# Patient Record
Sex: Male | Born: 1945 | Race: White | Hispanic: No | Marital: Married | Smoking: Former smoker
Health system: Southern US, Community
[De-identification: ages and names within clinical notes are randomized; demographics above are authoritative.]

## PROBLEM LIST (undated history)

## (undated) DIAGNOSIS — E7800 Pure hypercholesterolemia, unspecified: Secondary | ICD-10-CM

## (undated) DIAGNOSIS — Z8719 Personal history of other diseases of the digestive system: Secondary | ICD-10-CM

## (undated) DIAGNOSIS — J439 Emphysema, unspecified: Secondary | ICD-10-CM

## (undated) DIAGNOSIS — R748 Abnormal levels of other serum enzymes: Secondary | ICD-10-CM

## (undated) DIAGNOSIS — S83249A Other tear of medial meniscus, current injury, unspecified knee, initial encounter: Secondary | ICD-10-CM

## (undated) DIAGNOSIS — R519 Headache, unspecified: Secondary | ICD-10-CM

## (undated) DIAGNOSIS — G4730 Sleep apnea, unspecified: Secondary | ICD-10-CM

## (undated) DIAGNOSIS — C801 Malignant (primary) neoplasm, unspecified: Secondary | ICD-10-CM

## (undated) DIAGNOSIS — K4020 Bilateral inguinal hernia, without obstruction or gangrene, not specified as recurrent: Secondary | ICD-10-CM

## (undated) DIAGNOSIS — I2510 Atherosclerotic heart disease of native coronary artery without angina pectoris: Secondary | ICD-10-CM

## (undated) DIAGNOSIS — Z87442 Personal history of urinary calculi: Secondary | ICD-10-CM

## (undated) DIAGNOSIS — N179 Acute kidney failure, unspecified: Secondary | ICD-10-CM

## (undated) DIAGNOSIS — M722 Plantar fascial fibromatosis: Secondary | ICD-10-CM

## (undated) DIAGNOSIS — D049 Carcinoma in situ of skin, unspecified: Secondary | ICD-10-CM

## (undated) DIAGNOSIS — L719 Rosacea, unspecified: Secondary | ICD-10-CM

## (undated) DIAGNOSIS — K429 Umbilical hernia without obstruction or gangrene: Secondary | ICD-10-CM

## (undated) HISTORY — PX: EYE SURGERY: SHX253

## (undated) HISTORY — PX: COLONOSCOPY: SHX174

## (undated) HISTORY — PX: OTHER SURGICAL HISTORY: SHX169

## (undated) HISTORY — PX: HERNIA REPAIR: SHX51

---

## 2004-05-23 ENCOUNTER — Ambulatory Visit: Payer: Self-pay | Admitting: Ophthalmology

## 2004-07-25 ENCOUNTER — Ambulatory Visit: Payer: Self-pay | Admitting: Ophthalmology

## 2005-11-10 ENCOUNTER — Ambulatory Visit: Payer: Self-pay | Admitting: Unknown Physician Specialty

## 2008-05-04 ENCOUNTER — Other Ambulatory Visit: Payer: Self-pay

## 2008-05-04 ENCOUNTER — Ambulatory Visit: Payer: Self-pay | Admitting: General Surgery

## 2008-05-04 ENCOUNTER — Ambulatory Visit: Payer: Self-pay | Admitting: Cardiology

## 2008-05-05 ENCOUNTER — Ambulatory Visit: Payer: Self-pay | Admitting: General Surgery

## 2012-08-14 DIAGNOSIS — M6208 Separation of muscle (nontraumatic), other site: Secondary | ICD-10-CM

## 2012-08-14 HISTORY — DX: Separation of muscle (nontraumatic), other site: M62.08

## 2013-07-04 ENCOUNTER — Ambulatory Visit: Payer: Self-pay | Admitting: Family Medicine

## 2013-08-14 DIAGNOSIS — E785 Hyperlipidemia, unspecified: Secondary | ICD-10-CM

## 2013-08-14 DIAGNOSIS — I10 Essential (primary) hypertension: Secondary | ICD-10-CM

## 2013-08-14 HISTORY — DX: Essential (primary) hypertension: I10

## 2013-08-14 HISTORY — DX: Hyperlipidemia, unspecified: E78.5

## 2014-07-24 ENCOUNTER — Ambulatory Visit: Payer: Self-pay | Admitting: Family Medicine

## 2016-02-28 ENCOUNTER — Other Ambulatory Visit: Payer: Self-pay | Admitting: Family Medicine

## 2016-02-28 DIAGNOSIS — R911 Solitary pulmonary nodule: Secondary | ICD-10-CM

## 2016-03-07 ENCOUNTER — Ambulatory Visit: Payer: 59

## 2016-03-10 ENCOUNTER — Other Ambulatory Visit: Payer: Self-pay | Admitting: Family Medicine

## 2016-03-10 DIAGNOSIS — Z87891 Personal history of nicotine dependence: Secondary | ICD-10-CM

## 2016-03-10 DIAGNOSIS — R911 Solitary pulmonary nodule: Secondary | ICD-10-CM

## 2016-03-13 ENCOUNTER — Ambulatory Visit
Admission: RE | Admit: 2016-03-13 | Discharge: 2016-03-13 | Disposition: A | Discharge status: Home / Self Care | Payer: 59 | Source: Ambulatory Visit | Attending: Family Medicine | Admitting: Family Medicine

## 2016-03-13 DIAGNOSIS — Z87891 Personal history of nicotine dependence: Secondary | ICD-10-CM | POA: Insufficient documentation

## 2016-03-13 DIAGNOSIS — I2510 Atherosclerotic heart disease of native coronary artery without angina pectoris: Secondary | ICD-10-CM | POA: Insufficient documentation

## 2016-03-13 DIAGNOSIS — R911 Solitary pulmonary nodule: Secondary | ICD-10-CM | POA: Insufficient documentation

## 2016-03-13 DIAGNOSIS — I700 Atherosclerosis of aorta: Secondary | ICD-10-CM | POA: Insufficient documentation

## 2017-08-14 DIAGNOSIS — R739 Hyperglycemia, unspecified: Secondary | ICD-10-CM

## 2017-08-14 HISTORY — DX: Hyperglycemia, unspecified: R73.9

## 2017-09-11 ENCOUNTER — Other Ambulatory Visit: Payer: Self-pay | Admitting: Family Medicine

## 2017-09-11 DIAGNOSIS — Z122 Encounter for screening for malignant neoplasm of respiratory organs: Secondary | ICD-10-CM

## 2017-09-11 DIAGNOSIS — R911 Solitary pulmonary nodule: Secondary | ICD-10-CM

## 2017-09-17 ENCOUNTER — Other Ambulatory Visit: Payer: Self-pay | Admitting: Family Medicine

## 2017-09-17 DIAGNOSIS — R911 Solitary pulmonary nodule: Secondary | ICD-10-CM

## 2017-09-18 ENCOUNTER — Ambulatory Visit
Admission: RE | Admit: 2017-09-18 | Discharge: 2017-09-18 | Disposition: A | Discharge status: Home / Self Care | Payer: Commercial Managed Care - HMO | Source: Ambulatory Visit | Attending: Family Medicine | Admitting: Family Medicine

## 2017-09-18 DIAGNOSIS — I700 Atherosclerosis of aorta: Secondary | ICD-10-CM | POA: Insufficient documentation

## 2017-09-18 DIAGNOSIS — R911 Solitary pulmonary nodule: Secondary | ICD-10-CM | POA: Diagnosis present

## 2020-07-27 ENCOUNTER — Other Ambulatory Visit: Payer: Self-pay | Admitting: Family Medicine

## 2020-07-27 DIAGNOSIS — Z122 Encounter for screening for malignant neoplasm of respiratory organs: Secondary | ICD-10-CM

## 2020-07-27 DIAGNOSIS — H18469 Peripheral corneal degeneration, unspecified eye: Secondary | ICD-10-CM

## 2020-08-11 ENCOUNTER — Ambulatory Visit: Payer: Medicare HMO

## 2020-08-17 ENCOUNTER — Ambulatory Visit: Payer: Medicare HMO

## 2020-09-02 ENCOUNTER — Ambulatory Visit: Payer: Medicare HMO

## 2020-09-14 ENCOUNTER — Ambulatory Visit: Payer: Medicare HMO

## 2021-08-30 ENCOUNTER — Telehealth: Payer: Self-pay | Admitting: Acute Care

## 2021-08-30 NOTE — Telephone Encounter (Signed)
Returned call to patient regarding LCS.  No answer. Left voicemail and call back number.

## 2021-08-31 ENCOUNTER — Other Ambulatory Visit: Payer: Self-pay

## 2021-08-31 DIAGNOSIS — Z87891 Personal history of nicotine dependence: Secondary | ICD-10-CM

## 2021-09-14 ENCOUNTER — Ambulatory Visit (INDEPENDENT_AMBULATORY_CARE_PROVIDER_SITE_OTHER): Payer: Medicare HMO | Admitting: Acute Care

## 2021-09-14 ENCOUNTER — Encounter: Payer: Self-pay | Admitting: Acute Care

## 2021-09-14 ENCOUNTER — Other Ambulatory Visit: Payer: Self-pay

## 2021-09-14 DIAGNOSIS — Z87891 Personal history of nicotine dependence: Secondary | ICD-10-CM | POA: Diagnosis not present

## 2021-09-14 NOTE — Progress Notes (Addendum)
Virtual Visit via Telephone Note  I connected with Jeffery Cannon on 09/21/21 at 11:00 AM EST by telephone and verified that I am speaking with the correct person using two identifiers.  Location: Patient: At home  Provider:  [place], [place], [place], [address]    I discussed the limitations, risks, security and privacy concerns of performing an evaluation and management service by telephone and the availability of in person appointments. I also discussed with the patient that there may be a patient responsible charge related to this service. The patient expressed understanding and agreed to proceed.   Shared Decision Making Visit Lung Cancer Screening Program [phone])   Eligibility: Age 76 y.o. Pack Years Smoking History Calculation 20 pack year smoking hx  (# packs/per year x # years smoked) Recent History of coughing up blood  no Unexplained weight loss? no ( >Than 15 pounds within the last 6 months ) Prior History Lung / other cancer no (Diagnosis within the last 5 years already requiring surveillance chest CT Scans). Smoking Status Former Smoker Former Smokers: Years since quit: 12 years  Quit Date: 2012  Visit Components: Discussion included one or more decision making aids. yes Discussion included risk/benefits of screening. yes Discussion included potential follow up diagnostic testing for abnormal scans. yes Discussion included meaning and risk of over diagnosis. yes Discussion included meaning and risk of False Positives. yes Discussion included meaning of total radiation exposure. yes  Counseling Included: Importance of adherence to annual lung cancer LDCT screening. yes Impact of comorbidities on ability to participate in the program. yes Ability and willingness to under diagnostic treatment. yes  Smoking Cessation Counseling: Current Smokers:  Discussed importance of smoking cessation. yes Information about tobacco cessation classes and  interventions provided to patient. yes Patient provided with "ticket" for LDCT Scan. yes Symptomatic Patient. no  Counseling NA Diagnosis Code: Tobacco Use Z72.0 Asymptomatic Patient yes  Counseling (Intermediate counseling: > three minutes counseling) F6812 Former Smokers:  Discussed the importance of maintaining cigarette abstinence. yes Diagnosis Code: Personal History of Nicotine Dependence. X51.700 Information about tobacco cessation classes and interventions provided to patient. Yes Patient provided with "ticket" for LDCT Scan. yes Written Order for Lung Cancer Screening with LDCT placed in Epic. Yes (CT Chest Lung Cancer Screening Low Dose W/O CM) [id number] Z12.2-Screening of respiratory organs Z87.891-Personal history of nicotine dependence  I spent 25 minutes of face to face time/virtual visit time  with Jeffery Cannon discussing the risks and benefits of lung cancer screening. We took the time to pause the power point at intervals to allow for questions to be asked and answered to ensure understanding. We discussed that he had taken the single most powerful action possible to decrease his risk of developing lung cancer when he quit smoking. I counseled him to remain smoke free, and to contact me if he ever had the desire to smoke again so that I can provide resources and tools to help support the effort to remain smoke free. We discussed the time and location of the scan, and that either  Jeffery Glassman RN, Jeffery Prince, RN or I  or I will call / send a letter with the results within  24-72 hours of receiving them. She has the office contact information in the event she needs to speak with me,  he verbalized understanding of all of the above and had no further questions upon leaving the office.     I explained to the patient that there has  been a high incidence of coronary artery disease noted on these exams. I explained that this is a non-gated exam therefore degree or severity cannot be  determined. This patient is on statin therapy. I have asked the patient to follow-up with their PCP regarding any incidental finding of coronary artery disease and management with diet or medication as they feel is clinically indicated. The patient verbalized understanding of the above and had no further questions.   I spent 30 minutes dedicated to the care of this patient on the date of this encounter to include pre-visit review of records, non-face-to-face time with the patient discussing conditions above, post visit ordering of testing, clinical documentation with the electronic health record, making appropriate referrals as documented, and communicating necessary information to the patient's healthcare team.   Magdalen Spatz, NP 09/14/2021

## 2021-09-14 NOTE — Patient Instructions (Signed)
Thank you for participating in the [facility] Screening [facility]. °It was our pleasure to meet you today. °We will call you with the results of your scan within the next few days. °Your scan will be assigned a Lung RADS category score by the physicians reading the scans.  °This Lung RADS score determines follow up scanning.  °See below for description of categories, and follow up screening recommendations. °We will be in touch to schedule your follow up screening annually or based on recommendations of our providers. °We will fax a copy of your scan results to your Primary Care Physician, or the physician who referred you to the program, to ensure they have the results. °Please call the office if you have any questions or concerns regarding your scanning experience or results.  °Our office number is [phone]. °Please speak with Denise Phelps, RN. She is our Lung Cancer Screening RN. °If she is unavailable when you call, please have the office staff send her a message. She will return your call at her earliest convenience. °Remember, if your scan is normal, we will scan you annually as long as you continue to meet the criteria for the program. (Age 55-77, Current smoker or smoker who has quit within the last 15 years). °If you are a smoker, remember, quitting is the single most powerful action that you can take to decrease your risk of lung cancer and other pulmonary, breathing related problems. °We know quitting is hard, and we are here to help.  °Please let us know if there is anything we can do to help you meet your goal of quitting. °If you are a former smoker, congratulations. We are proud of you! Remain smoke free! °Remember you can refer friends or family members through the number above.  °We will screen them to make sure they meet criteria for the program. °Thank you for helping us take better care of you by participating in Lung Screening. ° °You can receive free nicotine replacement therapy  ( patches, gum or mints) by calling 1-800-QUIT NOW. Please call so we can get you on the path to becoming  a non-smoker. I know it is hard, but you can do this! ° °Lung RADS Categories: ° °Lung RADS 1: no nodules or definitely non-concerning nodules.  °Recommendation is for a repeat annual scan in 12 months. ° °Lung RADS 2:  nodules that are non-concerning in appearance and behavior with a very low likelihood of becoming an active cancer. °Recommendation is for a repeat annual scan in 12 months. ° °Lung RADS 3: nodules that are probably non-concerning , includes nodules with a low likelihood of becoming an active cancer.  Recommendation is for a 6-month repeat screening scan. Often noted after an upper respiratory illness. We will be in touch to make sure you have no questions, and to schedule your 6-month scan. ° °Lung RADS 4 A: nodules with concerning findings, recommendation is most often for a follow up scan in 3 months or additional testing based on our provider's assessment of the scan. We will be in touch to make sure you have no questions and to schedule the recommended 3 month follow up scan. ° °Lung RADS 4 B:  indicates findings that are concerning. We will be in touch with you to schedule additional diagnostic testing based on our provider's  assessment of the scan. ° °Hypnosis for smoking cessation  °[employer] °[phone] ° °Acupuncture for smoking cessation  °[facility] °[phone]  °

## 2021-09-15 ENCOUNTER — Ambulatory Visit
Admission: RE | Admit: 2021-09-15 | Discharge: 2021-09-15 | Disposition: A | Discharge status: Home / Self Care | Payer: Medicare HMO | Source: Ambulatory Visit | Attending: Acute Care | Admitting: Acute Care

## 2021-09-15 ENCOUNTER — Other Ambulatory Visit: Payer: Self-pay

## 2021-09-15 DIAGNOSIS — Z87891 Personal history of nicotine dependence: Secondary | ICD-10-CM | POA: Insufficient documentation

## 2021-09-19 ENCOUNTER — Other Ambulatory Visit: Payer: Self-pay | Admitting: Acute Care

## 2021-09-19 DIAGNOSIS — Z87891 Personal history of nicotine dependence: Secondary | ICD-10-CM

## 2022-02-06 ENCOUNTER — Other Ambulatory Visit: Payer: Self-pay | Admitting: Family Medicine

## 2022-02-06 DIAGNOSIS — R1032 Left lower quadrant pain: Secondary | ICD-10-CM

## 2022-02-10 ENCOUNTER — Ambulatory Visit
Admission: RE | Admit: 2022-02-10 | Discharge: 2022-02-10 | Disposition: A | Discharge status: Home / Self Care | Payer: Medicare HMO | Source: Ambulatory Visit | Attending: Family Medicine | Admitting: Family Medicine

## 2022-02-10 DIAGNOSIS — R1032 Left lower quadrant pain: Secondary | ICD-10-CM | POA: Diagnosis present

## 2022-02-28 ENCOUNTER — Ambulatory Visit: Payer: Self-pay | Admitting: General Surgery

## 2022-02-28 NOTE — H&P (Signed)
PATIENT PROFILE: Jeffery Cannon is a 76 y.o. male who presents to the Clinic for consultation at the request of Dr. Baldemar Lenis for evaluation of bilateral inguinal hernia.  PCP:  Barnabas Lister, MD  HISTORY OF PRESENT ILLNESS: Jeffery Cannon reports he has been feeling a bulge in the left groin.  These usually cause discomfort and mild pain in the left groin.  No pain radiation.  This aggravated by straining and different strenuous activities.  Alleviating factors resting and reducing the hernia.  He feels that there is intestine in the bulge.  This is soft and he is able to reduce.  He denies any episode of abdominal distention nausea or vomiting.  Due to the difficulty feeling the bulge on physical exam by the PCP CT scan of the abdomen and pelvis was ordered that shows bilateral fat-containing inguinal hernia.  I personally evaluated the images.   PROBLEM LIST: Problem List  Date Reviewed: 02/06/2022          Noted   Elevated liver enzymes 05/16/2019   Elevated blood sugar level, unspecified 09/07/2017   Essential hypertension, benign 01/26/2014   Other and unspecified hyperlipidemia 01/26/2014   Medial meniscus tear 01/23/2013   Diastasis recti 09/30/2012    GENERAL REVIEW OF SYSTEMS:   General ROS: negative for - chills, fatigue, fever, weight gain or weight loss Allergy and Immunology ROS: negative for - hives  Hematological and Lymphatic ROS: negative for - bleeding problems or bruising, negative for palpable nodes Endocrine ROS: negative for - heat or cold intolerance, hair changes Respiratory ROS: negative for - cough, shortness of breath or wheezing Cardiovascular ROS: no chest pain or palpitations GI ROS: negative for nausea, vomiting, abdominal pain, diarrhea, constipation Musculoskeletal ROS: negative for - joint swelling or muscle pain Neurological ROS: negative for - confusion, syncope Dermatological ROS: negative for pruritus and rash Psychiatric: negative for anxiety,  depression, difficulty sleeping and memory loss  MEDICATIONS: Current Outpatient Medications  Medication Sig Dispense Refill   amLODIPine (NORVASC) 5 MG tablet TAKE 1 TABLET BY MOUTH EVERY DAY 90 tablet 1   aspirin 81 MG EC tablet Take 81 mg by mouth nightly.     co-enzyme Q-10, ubiquinone, 200 mg capsule Take 200 mg by mouth daily.     cyanocobalamin, vitamin B-12, 500 mcg TbER Take 1 tablet by mouth daily.     GLUC SU/CHONDRO SU A/VIT C/MN (GLUCOSAMINE CHONDROITIN MAXSTR ORAL) Take 1 tablet by mouth daily.     HYDROcodone-acetaminophen (NORCO) 5-325 mg tablet Take 1 tablet by mouth every 6 (six) hours as needed for Pain 20 tablet 0   KRILL OIL ORAL Take 1 tablet by mouth daily.     magnesium oxide (MAG-OX) 400 mg (241.3 mg magnesium) tablet Take 400 mg by mouth once daily     meloxicam (MOBIC) 15 MG tablet Take 1 tablet (15 mg total) by mouth once daily     multivitamin capsule Take 1 capsule by mouth daily.     rosuvastatin (CRESTOR) 20 MG tablet Take 20 mg by mouth once daily     telmisartan-hydroCHLOROthiazide (MICARDIS HCT) 80-25 mg tablet TAKE 1 TABLET BY MOUTH EVERY DAY *REPLACES OLMESARTAN/HCTZ* 90 tablet 1   turmeric 400 mg Cap Take 400 mg by mouth once daily     No current facility-administered medications for this visit.    ALLERGIES: Patient has no known allergies.  PAST MEDICAL HISTORY: Past Medical History:  Diagnosis Date   Cancer (CMS-HCC)    basal cell carcinoma- skin  Hyperlipidemia    Hypertension    Sleep apnea    uses CPAP   Umbilical hernia     PAST SURGICAL HISTORY: Past Surgical History:  Procedure Laterality Date   COLONOSCOPY  11/10/2005   Int Hemorrhoids, Diverticulosis: CBF 10/2015; Recall Ltr mailed 09/10/2015 (dw)   ARTHROSCOPY KNEE W/MENISCECTOMY Right 02/24/2013   Procedure: ARTHROSCOPY, KNEE,  WITH MENISCECTOMY ;  Surgeon: Kristine Garbe, MD;  Location: [facility];  Service: Orthopedics;  Laterality: Right;   ARTHROSCOPY KNEE  W/SYNOVECTOMY Right 02/24/2013   Procedure: ARTHROSCOPY KNEE W/SYNOVECTOMY;  Surgeon: Opal Sidles III, MD;  Location: [facility];  Service: Orthopedics;  Laterality: Right;   COLONOSCOPY  10/10/2016   Hyperplastic Polyp: CBF 09/2026   CATARACT EXTRACTION     FRACTURE NASAL TURBINATES     HERNIA REPAIR     umbilical     FAMILY HISTORY: Family History  Problem Relation Age of Onset   High blood pressure (Hypertension) Mother    Cancer Mother        Bladder Cancer   High blood pressure (Hypertension) Father    Heart disease Father    Anesthesia problems Neg Hx      SOCIAL HISTORY: Social History   Socioeconomic History   Marital status: Married  Tobacco Use   Smoking status: Former    Types: Cigarettes    Quit date: 10/01/1983    Years since quitting: 38.4   Smokeless tobacco: Never  Substance and Sexual Activity   Alcohol use: Yes   Drug use: Never   Sexual activity: Defer    PHYSICAL EXAM: Vitals:   02/28/22 0948  BP: 134/73  Pulse: 54   Body mass index is 29.5 kg/m. Weight: 88 kg (194 lb)   GENERAL: Alert, active, oriented x3  HEENT: Pupils equal reactive to light. Extraocular movements are intact. Sclera clear. Palpebral conjunctiva normal red color.Pharynx clear.  NECK: Supple with no palpable mass and no adenopathy.  LUNGS: Sound clear with no rales rhonchi or wheezes.  HEART: Regular rhythm S1 and S2 without murmur.  ABDOMEN: Soft and depressible, nontender with no palpable mass, no hepatomegaly.  Palpable left reducible inguinal hernia.  Difficult to palpate right middle hernia.  EXTREMITIES: Well-developed well-nourished symmetrical with no dependent edema.  NEUROLOGICAL: Awake alert oriented, facial expression symmetrical, moving all extremities.  REVIEW OF DATA: I have reviewed the following data today: Appointment on 02/03/2022  Component Date Value   WBC (White Blood Cell Co* 02/03/2022 7.2    RBC (Red Blood Cell Coun* 02/03/2022 4.96     Hemoglobin 02/03/2022 15.0    Hematocrit 02/03/2022 45.7    MCV (Mean Corpuscular Vo* 02/03/2022 92.1    MCH (Mean Corpuscular He* 02/03/2022 30.2    MCHC (Mean Corpuscular H* 02/03/2022 32.8    Platelet Count 02/03/2022 227    RDW-CV (Red Cell Distrib* 02/03/2022 13.1    MPV (Mean Platelet Volum* 02/03/2022 9.5    Neutrophils 02/03/2022 4.50    Lymphocytes 02/03/2022 2.00    Mixed Count 02/03/2022 0.70    Neutrophil % 02/03/2022 62.8    Lymphocyte % 02/03/2022 27.7    Mixed % 02/03/2022 9.5    Glucose 02/03/2022 90    Sodium 02/03/2022 141    Potassium 02/03/2022 4.6    Chloride 02/03/2022 104    Carbon Dioxide (CO2) 02/03/2022 30.5    Urea Nitrogen (BUN) 02/03/2022 21    Creatinine 02/03/2022 1.3    Glomerular Filtration Ra* 02/03/2022 54 (L)  Calcium 02/03/2022 9.7    AST  02/03/2022 26    ALT  02/03/2022 24    Alk Phos (alkaline Phosp* 02/03/2022 51    Albumin 02/03/2022 4.8    Bilirubin, Total 02/03/2022 0.6    Protein, Total 02/03/2022 7.0    A/G Ratio 02/03/2022 2.2    Hemoglobin A1C 02/03/2022 5.9 (H)    Average Blood Glucose (C* 02/03/2022 123    Cholesterol, Total 02/03/2022 156    Triglyceride 02/03/2022 123    HDL (High Density Lipopr* 02/03/2022 66.7    LDL Calculated 02/03/2022 65    VLDL Cholesterol 02/03/2022 25    Cholesterol/HDL Ratio 02/03/2022 2.3      ASSESSMENT: Mr. Guzzetta is a 76 y.o. male presenting for consultation for bilateral inguinal hernia.    The patient presents with a symptomatic, reducible bilateral inguinal hernia. Patient was oriented about the diagnosis of inguinal hernia and its implication. The patient was oriented about the treatment alternatives (observation vs surgical repair). Due to patient symptoms, repair is recommended. Patient oriented about the surgical procedure, the use of mesh and its risk of complications such as: infection, bleeding, injury to vas deference, vasculature and testicle, injury to bowel or bladder, and  chronic pain.   Non-recurrent bilateral inguinal hernia without obstruction or gangrene [K40.20]  PLAN: 1.  Robotic assisted laparoscopic bilateral inguinal hernia repair with mesh ([id number]) 2.  Hold aspirin today 3.  Contact us if has any question or concern.   Patient verbalized understanding, all questions were answered, and were agreeable with the plan outlined above.    Herbert Pun, MD  Electronically signed by Herbert Pun, MD

## 2022-02-28 NOTE — H&P (View-Only) (Signed)
PATIENT PROFILE: Jeffery Cannon is a 76 y.o. male who presents to the Clinic for consultation at the request of Dr. Baldemar Lenis for evaluation of bilateral inguinal hernia.  PCP:  Barnabas Lister, MD  HISTORY OF PRESENT ILLNESS: Mr. Busk reports he has been feeling a bulge in the left groin.  These usually cause discomfort and mild pain in the left groin.  No pain radiation.  This aggravated by straining and different strenuous activities.  Alleviating factors resting and reducing the hernia.  He feels that there is intestine in the bulge.  This is soft and he is able to reduce.  He denies any episode of abdominal distention nausea or vomiting.  Due to the difficulty feeling the bulge on physical exam by the PCP CT scan of the abdomen and pelvis was ordered that shows bilateral fat-containing inguinal hernia.  I personally evaluated the images.   PROBLEM LIST: Problem List  Date Reviewed: 02/06/2022          Noted   Elevated liver enzymes 05/16/2019   Elevated blood sugar level, unspecified 09/07/2017   Essential hypertension, benign 01/26/2014   Other and unspecified hyperlipidemia 01/26/2014   Medial meniscus tear 01/23/2013   Diastasis recti 09/30/2012    GENERAL REVIEW OF SYSTEMS:   General ROS: negative for - chills, fatigue, fever, weight gain or weight loss Allergy and Immunology ROS: negative for - hives  Hematological and Lymphatic ROS: negative for - bleeding problems or bruising, negative for palpable nodes Endocrine ROS: negative for - heat or cold intolerance, hair changes Respiratory ROS: negative for - cough, shortness of breath or wheezing Cardiovascular ROS: no chest pain or palpitations GI ROS: negative for nausea, vomiting, abdominal pain, diarrhea, constipation Musculoskeletal ROS: negative for - joint swelling or muscle pain Neurological ROS: negative for - confusion, syncope Dermatological ROS: negative for pruritus and rash Psychiatric: negative for anxiety,  depression, difficulty sleeping and memory loss  MEDICATIONS: Current Outpatient Medications  Medication Sig Dispense Refill   amLODIPine (NORVASC) 5 MG tablet TAKE 1 TABLET BY MOUTH EVERY DAY 90 tablet 1   aspirin 81 MG EC tablet Take 81 mg by mouth nightly.     co-enzyme Q-10, ubiquinone, 200 mg capsule Take 200 mg by mouth daily.     cyanocobalamin, vitamin B-12, 500 mcg TbER Take 1 tablet by mouth daily.     GLUC SU/CHONDRO SU A/VIT C/MN (GLUCOSAMINE CHONDROITIN MAXSTR ORAL) Take 1 tablet by mouth daily.     HYDROcodone-acetaminophen (NORCO) 5-325 mg tablet Take 1 tablet by mouth every 6 (six) hours as needed for Pain 20 tablet 0   KRILL OIL ORAL Take 1 tablet by mouth daily.     magnesium oxide (MAG-OX) 400 mg (241.3 mg magnesium) tablet Take 400 mg by mouth once daily     meloxicam (MOBIC) 15 MG tablet Take 1 tablet (15 mg total) by mouth once daily     multivitamin capsule Take 1 capsule by mouth daily.     rosuvastatin (CRESTOR) 20 MG tablet Take 20 mg by mouth once daily     telmisartan-hydroCHLOROthiazide (MICARDIS HCT) 80-25 mg tablet TAKE 1 TABLET BY MOUTH EVERY DAY *REPLACES OLMESARTAN/HCTZ* 90 tablet 1   turmeric 400 mg Cap Take 400 mg by mouth once daily     No current facility-administered medications for this visit.    ALLERGIES: Patient has no known allergies.  PAST MEDICAL HISTORY: Past Medical History:  Diagnosis Date   Cancer (CMS-HCC)    basal cell carcinoma- skin  Hyperlipidemia    Hypertension    Sleep apnea    uses CPAP   Umbilical hernia     PAST SURGICAL HISTORY: Past Surgical History:  Procedure Laterality Date   COLONOSCOPY  11/10/2005   Int Hemorrhoids, Diverticulosis: CBF 10/2015; Recall Ltr mailed 09/10/2015 (dw)   ARTHROSCOPY KNEE W/MENISCECTOMY Right 02/24/2013   Procedure: ARTHROSCOPY, KNEE,  WITH MENISCECTOMY ;  Surgeon: Kristine Garbe, MD;  Location: [facility];  Service: Orthopedics;  Laterality: Right;   ARTHROSCOPY KNEE  W/SYNOVECTOMY Right 02/24/2013   Procedure: ARTHROSCOPY KNEE W/SYNOVECTOMY;  Surgeon: Opal Sidles III, MD;  Location: [facility];  Service: Orthopedics;  Laterality: Right;   COLONOSCOPY  10/10/2016   Hyperplastic Polyp: CBF 09/2026   CATARACT EXTRACTION     FRACTURE NASAL TURBINATES     HERNIA REPAIR     umbilical     FAMILY HISTORY: Family History  Problem Relation Age of Onset   High blood pressure (Hypertension) Mother    Cancer Mother        Bladder Cancer   High blood pressure (Hypertension) Father    Heart disease Father    Anesthesia problems Neg Hx      SOCIAL HISTORY: Social History   Socioeconomic History   Marital status: Married  Tobacco Use   Smoking status: Former    Types: Cigarettes    Quit date: 10/01/1983    Years since quitting: 38.4   Smokeless tobacco: Never  Substance and Sexual Activity   Alcohol use: Yes   Drug use: Never   Sexual activity: Defer    PHYSICAL EXAM: Vitals:   02/28/22 0948  BP: 134/73  Pulse: 54   Body mass index is 29.5 kg/m. Weight: 88 kg (194 lb)   GENERAL: Alert, active, oriented x3  HEENT: Pupils equal reactive to light. Extraocular movements are intact. Sclera clear. Palpebral conjunctiva normal red color.Pharynx clear.  NECK: Supple with no palpable mass and no adenopathy.  LUNGS: Sound clear with no rales rhonchi or wheezes.  HEART: Regular rhythm S1 and S2 without murmur.  ABDOMEN: Soft and depressible, nontender with no palpable mass, no hepatomegaly.  Palpable left reducible inguinal hernia.  Difficult to palpate right middle hernia.  EXTREMITIES: Well-developed well-nourished symmetrical with no dependent edema.  NEUROLOGICAL: Awake alert oriented, facial expression symmetrical, moving all extremities.  REVIEW OF DATA: I have reviewed the following data today: Appointment on 02/03/2022  Component Date Value   WBC (White Blood Cell Co* 02/03/2022 7.2    RBC (Red Blood Cell Coun* 02/03/2022 4.96     Hemoglobin 02/03/2022 15.0    Hematocrit 02/03/2022 45.7    MCV (Mean Corpuscular Vo* 02/03/2022 92.1    MCH (Mean Corpuscular He* 02/03/2022 30.2    MCHC (Mean Corpuscular H* 02/03/2022 32.8    Platelet Count 02/03/2022 227    RDW-CV (Red Cell Distrib* 02/03/2022 13.1    MPV (Mean Platelet Volum* 02/03/2022 9.5    Neutrophils 02/03/2022 4.50    Lymphocytes 02/03/2022 2.00    Mixed Count 02/03/2022 0.70    Neutrophil % 02/03/2022 62.8    Lymphocyte % 02/03/2022 27.7    Mixed % 02/03/2022 9.5    Glucose 02/03/2022 90    Sodium 02/03/2022 141    Potassium 02/03/2022 4.6    Chloride 02/03/2022 104    Carbon Dioxide (CO2) 02/03/2022 30.5    Urea Nitrogen (BUN) 02/03/2022 21    Creatinine 02/03/2022 1.3    Glomerular Filtration Ra* 02/03/2022 54 (L)  Calcium 02/03/2022 9.7    AST  02/03/2022 26    ALT  02/03/2022 24    Alk Phos (alkaline Phosp* 02/03/2022 51    Albumin 02/03/2022 4.8    Bilirubin, Total 02/03/2022 0.6    Protein, Total 02/03/2022 7.0    A/G Ratio 02/03/2022 2.2    Hemoglobin A1C 02/03/2022 5.9 (H)    Average Blood Glucose (C* 02/03/2022 123    Cholesterol, Total 02/03/2022 156    Triglyceride 02/03/2022 123    HDL (High Density Lipopr* 02/03/2022 66.7    LDL Calculated 02/03/2022 65    VLDL Cholesterol 02/03/2022 25    Cholesterol/HDL Ratio 02/03/2022 2.3      ASSESSMENT: Mr. Higbie is a 76 y.o. male presenting for consultation for bilateral inguinal hernia.    The patient presents with a symptomatic, reducible bilateral inguinal hernia. Patient was oriented about the diagnosis of inguinal hernia and its implication. The patient was oriented about the treatment alternatives (observation vs surgical repair). Due to patient symptoms, repair is recommended. Patient oriented about the surgical procedure, the use of mesh and its risk of complications such as: infection, bleeding, injury to vas deference, vasculature and testicle, injury to bowel or bladder, and  chronic pain.   Non-recurrent bilateral inguinal hernia without obstruction or gangrene [K40.20]  PLAN: 1.  Robotic assisted laparoscopic bilateral inguinal hernia repair with mesh ([id number]) 2.  Hold aspirin today 3.  Contact us if has any question or concern.   Patient verbalized understanding, all questions were answered, and were agreeable with the plan outlined above.    Herbert Pun, MD  Electronically signed by Herbert Pun, MD

## 2022-03-02 ENCOUNTER — Other Ambulatory Visit: Payer: Self-pay

## 2022-03-02 ENCOUNTER — Encounter
Admission: RE | Admit: 2022-03-02 | Discharge: 2022-03-02 | Disposition: A | Discharge status: Home / Self Care | Payer: Medicare HMO | Source: Ambulatory Visit | Attending: General Surgery | Admitting: General Surgery

## 2022-03-02 DIAGNOSIS — I10 Essential (primary) hypertension: Secondary | ICD-10-CM

## 2022-03-02 DIAGNOSIS — Z01812 Encounter for preprocedural laboratory examination: Secondary | ICD-10-CM

## 2022-03-02 HISTORY — DX: Personal history of urinary calculi: Z87.442

## 2022-03-02 HISTORY — DX: Rosacea, unspecified: L71.9

## 2022-03-02 HISTORY — DX: Emphysema, unspecified: J43.9

## 2022-03-02 HISTORY — DX: Acute kidney failure, unspecified: N17.9

## 2022-03-02 HISTORY — DX: Carcinoma in situ of skin, unspecified: D04.9

## 2022-03-02 HISTORY — DX: Headache, unspecified: R51.9

## 2022-03-02 HISTORY — DX: Personal history of other diseases of the digestive system: Z87.19

## 2022-03-02 HISTORY — DX: Abnormal levels of other serum enzymes: R74.8

## 2022-03-02 HISTORY — DX: Other tear of medial meniscus, current injury, unspecified knee, initial encounter: S83.249A

## 2022-03-02 HISTORY — DX: Plantar fascial fibromatosis: M72.2

## 2022-03-02 HISTORY — DX: Umbilical hernia without obstruction or gangrene: K42.9

## 2022-03-02 HISTORY — DX: Pure hypercholesterolemia, unspecified: E78.00

## 2022-03-02 HISTORY — DX: Bilateral inguinal hernia, without obstruction or gangrene, not specified as recurrent: K40.20

## 2022-03-02 HISTORY — DX: Sleep apnea, unspecified: G47.30

## 2022-03-02 HISTORY — DX: Malignant (primary) neoplasm, unspecified: C80.1

## 2022-03-02 HISTORY — DX: Atherosclerotic heart disease of native coronary artery without angina pectoris: I25.10

## 2022-03-02 NOTE — Patient Instructions (Addendum)
Your procedure is scheduled on: 03/06/22 - Monday Report to the Registration Desk on the 1st floor of the [facility]. To find out your arrival time, please call [phone] between 1PM - 3PM on 03/03/22 - Friday If your arrival time is 6:00 am, do not arrive prior to that time as the [facility] entrance doors do not open until 6:00 am.  REMEMBER: Instructions that are not followed completely may result in serious medical risk, up to and including death; or upon the discretion of your surgeon and anesthesiologist your surgery may need to be rescheduled.  Do not eat food or drink any fluids after midnight the night before surgery.  No gum chewing, lozengers or hard candies.  TAKE THESE MEDICATIONS THE MORNING OF SURGERY WITH A SIP OF WATER: NONE  Follow recommendations from Cardiologist, Pulmonologist or PCP regarding stopping Aspirin, Coumadin, Plavix, Eliquis, Pradaxa, or Pletal. Stop taking ASA 81 beginning 02/28/22.  One week prior to surgery: Stop Anti-inflammatories (NSAIDS) such as Advil, Aleve, Ibuprofen, Motrin, Naproxen, Naprosyn and Aspirin based products such as Excedrin, Goodys Powder, BC Powder.  Stop ANY OVER THE COUNTER supplements until after surgery.  You may  take Tylenol if needed for pain up until the day of surgery.  No Alcohol for 24 hours before or after surgery.  No Smoking including e-cigarettes for 24 hours prior to surgery.  No chewable tobacco products for at least 6 hours prior to surgery.  No nicotine patches on the day of surgery.  Do not use any "recreational" drugs for at least a week prior to your surgery.  Please be advised that the combination of cocaine and anesthesia may have negative outcomes, up to and including death. If you test positive for cocaine, your surgery will be cancelled.  On the morning of surgery brush your teeth with toothpaste and water, you may rinse your mouth with mouthwash if you wish. Do not swallow any toothpaste or  mouthwash.  Use CHG Soap or wipes as directed on instruction sheet.  Do not wear jewelry, make-up, hairpins, clips or nail polish.  Do not wear lotions, powders, or perfumes.   Do not shave body from the neck down 48 hours prior to surgery just in case you cut yourself which could leave a site for infection.  Also, freshly shaved skin may become irritated if using the CHG soap.  Contact lenses, hearing aids and dentures may not be worn into surgery.  Do not bring valuables to the hospital. [facility] is not responsible for any missing/lost belongings or valuables.   Bring your C-PAP to the hospital with you in case you may have to spend the night.   Notify your doctor if there is any change in your medical condition (cold, fever, infection).  Wear comfortable clothing (specific to your surgery type) to the hospital.  After surgery, you can help prevent lung complications by doing breathing exercises.  Take deep breaths and cough every 1-2 hours. Your doctor may order a device called an Incentive Spirometer to help you take deep breaths. When coughing or sneezing, hold a pillow firmly against your incision with both hands. This is called "splinting." Doing this helps protect your incision. It also decreases belly discomfort.  If you are being admitted to the hospital overnight, leave your suitcase in the car. After surgery it may be brought to your room.  If you are being discharged the day of surgery, you will not be allowed to drive home. You will need a  responsible adult (18 years or older) to drive you home and stay with you that night.   If you are taking public transportation, you will need to have a responsible adult (18 years or older) with you. Please confirm with your physician that it is acceptable to use public transportation.   Please call the [facility] Dept. at [phone] if you have any questions about these instructions.  Surgery Visitation  Policy:  Patients undergoing a surgery or procedure may have two family members or support persons with them as long as the person is not COVID-19 positive or experiencing its symptoms.   Inpatient Visitation:    Visiting hours are 7 a.m. to 8 p.m. Up to four visitors are allowed at one time in a patient room, including children. The visitors may rotate out with other people during the day. One designated support person (adult) may remain overnight.

## 2022-03-03 ENCOUNTER — Encounter
Admission: RE | Admit: 2022-03-03 | Discharge: 2022-03-03 | Disposition: A | Discharge status: Home / Self Care | Payer: Medicare HMO | Source: Ambulatory Visit | Attending: General Surgery | Admitting: General Surgery

## 2022-03-03 DIAGNOSIS — I10 Essential (primary) hypertension: Secondary | ICD-10-CM

## 2022-03-03 DIAGNOSIS — Z01812 Encounter for preprocedural laboratory examination: Secondary | ICD-10-CM

## 2022-03-03 DIAGNOSIS — Z01818 Encounter for other preprocedural examination: Secondary | ICD-10-CM | POA: Insufficient documentation

## 2022-03-06 ENCOUNTER — Ambulatory Visit: Payer: Medicare HMO | Admitting: Anesthesiology

## 2022-03-06 ENCOUNTER — Encounter
Admission: RE | Disposition: A | Discharge status: Home / Self Care | Payer: Self-pay | Source: Home / Self Care | Attending: General Surgery

## 2022-03-06 ENCOUNTER — Encounter: Payer: Self-pay | Admitting: General Surgery

## 2022-03-06 ENCOUNTER — Other Ambulatory Visit: Payer: Self-pay

## 2022-03-06 ENCOUNTER — Ambulatory Visit
Admission: RE | Admit: 2022-03-06 | Discharge: 2022-03-06 | Disposition: A | Discharge status: Home / Self Care | Payer: Medicare HMO | Attending: General Surgery | Admitting: General Surgery

## 2022-03-06 DIAGNOSIS — Z7982 Long term (current) use of aspirin: Secondary | ICD-10-CM | POA: Diagnosis not present

## 2022-03-06 DIAGNOSIS — Z87891 Personal history of nicotine dependence: Secondary | ICD-10-CM | POA: Insufficient documentation

## 2022-03-06 DIAGNOSIS — I10 Essential (primary) hypertension: Secondary | ICD-10-CM | POA: Diagnosis not present

## 2022-03-06 DIAGNOSIS — K4020 Bilateral inguinal hernia, without obstruction or gangrene, not specified as recurrent: Secondary | ICD-10-CM | POA: Insufficient documentation

## 2022-03-06 DIAGNOSIS — I2510 Atherosclerotic heart disease of native coronary artery without angina pectoris: Secondary | ICD-10-CM | POA: Insufficient documentation

## 2022-03-06 HISTORY — PX: INSERTION OF MESH: SHX5868

## 2022-03-06 SURGERY — REPAIR, HERNIA, INGUINAL, BILATERAL, ROBOT-ASSISTED
Anesthesia: General | Site: Groin | Laterality: Bilateral

## 2022-03-06 MED ORDER — FENTANYL CITRATE (PF) 100 MCG/2ML IJ SOLN
INTRAMUSCULAR | Status: AC
  Filled 2022-03-06: qty 2

## 2022-03-06 MED ORDER — ACETAMINOPHEN 10 MG/ML IV SOLN: 1000.0000 mg | Freq: Once | INTRAVENOUS | Status: DC | PRN

## 2022-03-06 MED ORDER — ACETAMINOPHEN 10 MG/ML IV SOLN
INTRAVENOUS | Status: AC
  Filled 2022-03-06: qty 100

## 2022-03-06 MED ORDER — OXYCODONE HCL 5 MG PO TABS
ORAL_TABLET | ORAL | Status: AC
  Filled 2022-03-06: qty 1

## 2022-03-06 MED ORDER — FENTANYL CITRATE (PF) 100 MCG/2ML IJ SOLN
INTRAMUSCULAR | Status: DC | PRN
  Administered 2022-03-06 (×2): 50 ug via INTRAVENOUS

## 2022-03-06 MED ORDER — ONDANSETRON HCL 4 MG/2ML IJ SOLN
INTRAMUSCULAR | Status: DC | PRN
  Administered 2022-03-06: 4 mg via INTRAVENOUS

## 2022-03-06 MED ORDER — EPHEDRINE 5 MG/ML INJ
INTRAVENOUS | Status: AC
  Filled 2022-03-06: qty 5

## 2022-03-06 MED ORDER — CEFAZOLIN SODIUM-DEXTROSE 2-4 GM/100ML-% IV SOLN
2.0000 g | INTRAVENOUS | Status: AC
  Administered 2022-03-06: 2 g via INTRAVENOUS

## 2022-03-06 MED ORDER — DEXAMETHASONE SODIUM PHOSPHATE 10 MG/ML IJ SOLN
INTRAMUSCULAR | Status: DC | PRN
  Administered 2022-03-06: 10 mg via INTRAVENOUS

## 2022-03-06 MED ORDER — LACTATED RINGERS IV SOLN: INTRAVENOUS | Status: DC

## 2022-03-06 MED ORDER — OXYCODONE HCL 5 MG PO TABS
5.0000 mg | ORAL_TABLET | Freq: Once | ORAL | Status: AC | PRN
  Administered 2022-03-06: 5 mg via ORAL

## 2022-03-06 MED ORDER — ONDANSETRON HCL 4 MG/2ML IJ SOLN: 4.0000 mg | Freq: Once | INTRAMUSCULAR | Status: DC | PRN

## 2022-03-06 MED ORDER — LIDOCAINE HCL (CARDIAC) PF 100 MG/5ML IV SOSY
PREFILLED_SYRINGE | INTRAVENOUS | Status: DC | PRN
  Administered 2022-03-06: 80 mg via INTRAVENOUS

## 2022-03-06 MED ORDER — SUGAMMADEX SODIUM 200 MG/2ML IV SOLN
INTRAVENOUS | Status: DC | PRN
  Administered 2022-03-06: 200 mg via INTRAVENOUS

## 2022-03-06 MED ORDER — OXYCODONE HCL 5 MG/5ML PO SOLN: 5.0000 mg | Freq: Once | ORAL | Status: AC | PRN

## 2022-03-06 MED ORDER — LIDOCAINE HCL (PF) 2 % IJ SOLN
INTRAMUSCULAR | Status: AC
  Filled 2022-03-06: qty 5

## 2022-03-06 MED ORDER — LACTATED RINGERS IV SOLN
INTRAVENOUS | Status: DC
Start: 2022-03-06 — End: 2022-03-06

## 2022-03-06 MED ORDER — BUPIVACAINE-EPINEPHRINE 0.25% -1:200000 IJ SOLN
INTRAMUSCULAR | Status: DC | PRN
  Administered 2022-03-06: 30 mL

## 2022-03-06 MED ORDER — PROPOFOL 10 MG/ML IV BOLUS
INTRAVENOUS | Status: DC | PRN
  Administered 2022-03-06: 140 mg via INTRAVENOUS

## 2022-03-06 MED ORDER — BUPIVACAINE-EPINEPHRINE (PF) 0.25% -1:200000 IJ SOLN
INTRAMUSCULAR | Status: AC
  Filled 2022-03-06: qty 30

## 2022-03-06 MED ORDER — CHLORHEXIDINE GLUCONATE 0.12 % MT SOLN
OROMUCOSAL | Status: AC
  Administered 2022-03-06: 15 mL via OROMUCOSAL
  Filled 2022-03-06: qty 15

## 2022-03-06 MED ORDER — HYDROCODONE-ACETAMINOPHEN 5-325 MG PO TABS
1.0000 | ORAL_TABLET | ORAL | 0 refills | Status: AC | PRN
Start: 2022-03-06 — End: 2022-03-09

## 2022-03-06 MED ORDER — CHLORHEXIDINE GLUCONATE 0.12 % MT SOLN: 15.0000 mL | Freq: Once | OROMUCOSAL | Status: AC

## 2022-03-06 MED ORDER — GLYCOPYRROLATE 0.2 MG/ML IJ SOLN
INTRAMUSCULAR | Status: DC | PRN
  Administered 2022-03-06: .2 mg via INTRAVENOUS

## 2022-03-06 MED ORDER — ORAL CARE MOUTH RINSE: 15.0000 mL | Freq: Once | OROMUCOSAL | Status: AC

## 2022-03-06 MED ORDER — EPHEDRINE SULFATE (PRESSORS) 50 MG/ML IJ SOLN
INTRAMUSCULAR | Status: DC | PRN
  Administered 2022-03-06 (×2): 5 mg via INTRAVENOUS

## 2022-03-06 MED ORDER — CEFAZOLIN SODIUM-DEXTROSE 2-4 GM/100ML-% IV SOLN
INTRAVENOUS | Status: AC
  Filled 2022-03-06: qty 100

## 2022-03-06 MED ORDER — GLYCOPYRROLATE 0.2 MG/ML IJ SOLN
INTRAMUSCULAR | Status: AC
  Filled 2022-03-06: qty 1

## 2022-03-06 MED ORDER — PROPOFOL 10 MG/ML IV BOLUS
INTRAVENOUS | Status: AC
  Filled 2022-03-06: qty 20

## 2022-03-06 MED ORDER — ACETAMINOPHEN 10 MG/ML IV SOLN
INTRAVENOUS | Status: DC | PRN
  Administered 2022-03-06: 1000 mg via INTRAVENOUS

## 2022-03-06 MED ORDER — FAMOTIDINE 20 MG PO TABS: 20.0000 mg | ORAL_TABLET | Freq: Once | ORAL | Status: AC

## 2022-03-06 MED ORDER — ROCURONIUM BROMIDE 100 MG/10ML IV SOLN
INTRAVENOUS | Status: DC | PRN
  Administered 2022-03-06 (×3): 20 mg via INTRAVENOUS
  Administered 2022-03-06: 40 mg via INTRAVENOUS

## 2022-03-06 MED ORDER — SEVOFLURANE IN SOLN
RESPIRATORY_TRACT | Status: AC
  Filled 2022-03-06: qty 250

## 2022-03-06 MED ORDER — FAMOTIDINE 20 MG PO TABS
ORAL_TABLET | ORAL | Status: AC
  Administered 2022-03-06: 20 mg via ORAL
  Filled 2022-03-06: qty 1

## 2022-03-06 MED ORDER — ONDANSETRON HCL 4 MG/2ML IJ SOLN
INTRAMUSCULAR | Status: AC
  Filled 2022-03-06: qty 2

## 2022-03-06 MED ORDER — FENTANYL CITRATE (PF) 100 MCG/2ML IJ SOLN
25.0000 ug | INTRAMUSCULAR | Status: DC | PRN
  Administered 2022-03-06 (×3): 50 ug via INTRAVENOUS

## 2022-03-06 SURGICAL SUPPLY — 50 items
ADH SKN CLS APL DERMABOND .7 (GAUZE/BANDAGES/DRESSINGS) ×2
BAG PRESSURE INF REUSE 1000 (BAG) IMPLANT
BLADE SURG SZ11 CARB STEEL (BLADE) ×3 IMPLANT
COVER TIP SHEARS 8 DVNC (MISCELLANEOUS) ×2 IMPLANT
COVER TIP SHEARS 8MM DA VINCI (MISCELLANEOUS) ×1
COVER WAND RF STERILE (DRAPES) ×3 IMPLANT
DERMABOND ADVANCED (GAUZE/BANDAGES/DRESSINGS) ×1
DERMABOND ADVANCED .7 DNX12 (GAUZE/BANDAGES/DRESSINGS) ×2 IMPLANT
DRAPE ARM DVNC X/XI (DISPOSABLE) ×6 IMPLANT
DRAPE COLUMN DVNC XI (DISPOSABLE) ×2 IMPLANT
DRAPE DA VINCI XI ARM (DISPOSABLE) ×3
DRAPE DA VINCI XI COLUMN (DISPOSABLE) ×1
ELECT REM PT RETURN 9FT ADLT (ELECTROSURGICAL) ×3
ELECTRODE REM PT RTRN 9FT ADLT (ELECTROSURGICAL) ×2 IMPLANT
GLOVE BIO SURGEON STRL SZ 6.5 (GLOVE) ×8 IMPLANT
GLOVE BIOGEL PI IND STRL 6.5 (GLOVE) ×4 IMPLANT
GLOVE BIOGEL PI INDICATOR 6.5 (GLOVE) ×4
GOWN STRL REUS W/ TWL LRG LVL3 (GOWN DISPOSABLE) ×6 IMPLANT
GOWN STRL REUS W/TWL LRG LVL3 (GOWN DISPOSABLE) ×12
IRRIGATOR SUCT 8 DISP DVNC XI (IRRIGATION / IRRIGATOR) IMPLANT
IRRIGATOR SUCTION 8MM XI DISP (IRRIGATION / IRRIGATOR)
IV CATH ANGIO 12GX3 LT BLUE (NEEDLE) ×1 IMPLANT
IV NS 1000ML (IV SOLUTION)
IV NS 1000ML BAXH (IV SOLUTION) IMPLANT
KIT PINK PAD W/HEAD ARE REST (MISCELLANEOUS) ×3
KIT PINK PAD W/HEAD ARM REST (MISCELLANEOUS) ×2 IMPLANT
LABEL OR SOLS (LABEL) IMPLANT
MANIFOLD NEPTUNE II (INSTRUMENTS) ×3 IMPLANT
MESH 3DMAX MID 5X7 LT XLRG (Mesh General) ×1 IMPLANT
MESH 3DMAX MID 5X7 RT XLRG (Mesh General) ×1 IMPLANT
NDL INSUFFLATION 14GA 120MM (NEEDLE) ×2 IMPLANT
NEEDLE HYPO 22GX1.5 SAFETY (NEEDLE) ×3 IMPLANT
NEEDLE INSUFFLATION 14GA 120MM (NEEDLE) ×3 IMPLANT
OBTURATOR OPTICAL STANDARD 8MM (TROCAR) ×1
OBTURATOR OPTICAL STND 8 DVNC (TROCAR) ×2
OBTURATOR OPTICALSTD 8 DVNC (TROCAR) ×2 IMPLANT
PACK LAP CHOLECYSTECTOMY (MISCELLANEOUS) ×3 IMPLANT
SEAL CANN UNIV 5-8 DVNC XI (MISCELLANEOUS) ×6 IMPLANT
SEAL XI 5MM-8MM UNIVERSAL (MISCELLANEOUS) ×3
SET TUBE SMOKE EVAC HIGH FLOW (TUBING) ×3 IMPLANT
SOLUTION ELECTROLUBE (MISCELLANEOUS) ×3 IMPLANT
SUT MNCRL 4-0 (SUTURE) ×6
SUT MNCRL 4-0 27XMFL (SUTURE) ×4
SUT VIC AB 2-0 SH 27 (SUTURE) ×6
SUT VIC AB 2-0 SH 27XBRD (SUTURE) ×2 IMPLANT
SUT VLOC 90 S/L VL9 GS22 (SUTURE) ×4 IMPLANT
SUTURE MNCRL 4-0 27XMF (SUTURE) ×2 IMPLANT
TAPE TRANSPORE STRL 2 31045 (GAUZE/BANDAGES/DRESSINGS) IMPLANT
TRAY FOLEY MTR SLVR 16FR STAT (SET/KITS/TRAYS/PACK) ×2 IMPLANT
WATER STERILE IRR 500ML POUR (IV SOLUTION) ×2 IMPLANT

## 2022-03-06 NOTE — Anesthesia Postprocedure Evaluation (Signed)
Anesthesia Post Note  Patient: Jeffery Cannon  Procedure(s) Performed: XI ROBOTIC ASSISTED BILATERAL INGUINAL HERNIA (Bilateral: Groin) INSERTION OF MESH  Patient location during evaluation: PACU Anesthesia Type: General Level of consciousness: awake and alert Pain management: pain level controlled Vital Signs Assessment: post-procedure vital signs reviewed and stable Respiratory status: spontaneous breathing, nonlabored ventilation, respiratory function stable and patient connected to nasal cannula oxygen Cardiovascular status: blood pressure returned to baseline and stable Postop Assessment: no apparent nausea or vomiting Anesthetic complications: no   No notable events documented.   Last Vitals:  Vitals:   03/06/22 1025 03/06/22 1030  BP:  104/69  Pulse: (!) 51 (!) 53  Resp: 15 13  Temp:    SpO2: 94% 95%    Last Pain:  Vitals:   03/06/22 1030  TempSrc:   PainSc: [name]

## 2022-03-06 NOTE — Anesthesia Preprocedure Evaluation (Addendum)
Anesthesia Evaluation  Patient identified by MRN, date of birth, ID band Patient awake    Reviewed: Allergy & Precautions, NPO status , Patient's Chart, lab work & pertinent test results  History of Anesthesia Complications Negative for: history of anesthetic complications  Airway Mallampati: IV   Neck ROM: Full    Dental no notable dental hx.    Pulmonary sleep apnea , former smoker (quit 2012),    Pulmonary exam normal breath sounds clear to auscultation       Cardiovascular hypertension, + CAD  Normal cardiovascular exam Rhythm:Regular Rate:Normal  ECG 03/03/22:  Sinus bradycardia Otherwise normal ECG When compared with ECG of 04-May-2008 12:29, No significant change was found  Echo 02/04/21:  1. The left ventricle is normal in size with normal wall thickness.  2. The left ventricular systolic function is normal, LVEF is visually estimated at > 55%.  3. The right ventricle is normal in size, with normal systolic function.  4. The aortic valve is trileaflet with mildly thickened leaflets with normal excursion.  5. Rhythm: Bradycardia.    Neuro/Psych  Headaches,    GI/Hepatic negative GI ROS,   Endo/Other  negative endocrine ROS  Renal/GU Renal disease (nephrolithiasis)     Musculoskeletal   Abdominal   Peds  Hematology negative hematology ROS (+)   Anesthesia Other Findings Cardiology note 01/02/22:  1. Cheyne-Stokes breathing 2. Family history of bicuspid aortic valve TTE showed no evidence of HF and trileaflet AV  -No further work up needed at this time  3. Coronary artery disease 4. History of prior cigarette smoking AAA screening negative. Lipids 07/2021: TC 193, TG 183, HDL 53, LDL 104. Reviewed left main and LAD calcified plaque on lung cancer screening CT with patient. -Increase rosuvastatin to 20 mg daily The 10-year ASCVD risk score (Arnett DK, et al., 2019) is: 25% Values used to  calculate the score: Age: 76 years Sex: Male Is Non-Hispanic African American: No Diabetic: No Tobacco smoker: No Systolic Blood Pressure: 093 mmHg Is BP treated: Yes HDL Cholesterol: 53 mg/dL Total Cholesterol: 193 mg/dL  Note: For patients with SBP <90 or >200, Total Cholesterol <130 or >320, HDL <20 or >100 which are outside of the allowable range, the calculator will use these upper or lower values to calculate the patient's risk score.  5. Essential hypertension Reasonable control, goal <130/80 -Continue telmisartan-hydrochlorothiazide 80-12.5 mg daily, amlodipine 5 mg daily  Return in about 1 year (around 01/03/2023) for Next scheduled follow up.   Reproductive/Obstetrics                            Anesthesia Physical Anesthesia Plan  ASA: 3  Anesthesia Plan: General   Post-op Pain Management:    Induction: Intravenous  PONV Risk Score and Plan: 2 and Ondansetron, Dexamethasone and Treatment may vary due to age or medical condition  Airway Management Planned: Oral ETT  Additional Equipment:   Intra-op Plan:   Post-operative Plan: Extubation in OR  Informed Consent: I have reviewed the patients History and Physical, chart, labs and discussed the procedure including the risks, benefits and alternatives for the proposed anesthesia with the patient or authorized representative who has indicated his/her understanding and acceptance.     Dental advisory given  Plan Discussed with: CRNA  Anesthesia Plan Comments: (Patient consented for risks of anesthesia including but not limited to:  - adverse reactions to medications - damage to eyes, teeth, lips or other oral mucosa -  nerve damage due to positioning  - sore throat or hoarseness - damage to heart, brain, nerves, lungs, other parts of body or loss of life  Informed patient about role of CRNA in peri- and intra-operative care.  Patient voiced understanding.)        Anesthesia Quick  Evaluation

## 2022-03-06 NOTE — Op Note (Signed)
Preoperative diagnosis: Bilateral inguinal hernia.   Postoperative diagnosis: Bilateral inguinal hernia.  Procedure: Robotic assisted Laparoscopic Transabdominal preperitoneal laparoscopic (TAPP) repair of bilateral inguinal hernia.  Anesthesia: GETA  Surgeon: Dr. Windell Moment  Wound Classification: Clean  Indications:  Patient is a 76 y.o. male developed a symptomatic bilateral inguinal hernia. Repair was indicated.  Findings: 1. Left indirect, right direct Inguinal hernias identified 2. Large left cord lipoma identified and reduced.  3. Vas deferens and cord structures identified and preserved 4. Bard Extra Large 3D Max MID Anatomical mesh used for repair 5. Adequate hemostasis.   Page 1:  Bilateral inguinal hernias Page 2: Post left inguinal hernia repair Page 3: Post right inguinal hernia repair         Description of procedure: The patient was taken to the operating room and the correct side of surgery was verified. The patient was placed supine with arms tucked at the sides. After obtaining adequate anesthesia, the patient's abdomen was prepped and draped in standard sterile fashion. The patient was placed in the Trendelenburg position. A time-out was completed verifying correct patient, procedure, site, positioning, and implant(s) and/or special equipment prior to beginning this procedure. A Veress needle was placed at the umbilicus and pneumoperitoneum created with insufflation of carbon dioxide to 15 mmHg. After the Veress needle was removed, an 8-mm trocar was placed on epigastric area and the 30 angled laparoscope inserted. Two 8-mm trocars were then placed lateral to the rectus sheath under direct visualization. Both inguinal regions were inspected and the median umbilical ligament, medial umbilical ligament, and lateral umbilical fold were identified.  The robotic arms were docked. The robotic scope was inserted and the pelvic area anatomy targeted.  The hernias from  both sides were fixed using the following technique.  The peritoneum was incised with scissors along a line 6 cm above the superior edge of the hernia defects, extending from the median umbilical ligament to the anterior superior iliac spine. The peritoneal flap was mobilized inferiorly using blunt and sharp dissection. The inferior epigastric vessels were exposed and the pubic symphysis was identified. Cooper's ligament was dissected to its junction with the iliac vein. The dissection was continued inferiorly to the iliopubic tract, with care taken to avoid injury to the femoral branch of the genitofemoral nerve and the lateral femoral cutaneous nerve. The cord structures were parietalized. The hernia was identified and reduced by gentle traction.  The hernia sac and lipoma were noted mobilized from the cord structures and reduced into the peritoneal cavity.  An extra large piece of mesh was rolled longitudinally into a compact cylinder and passed through a trocar. The cylinder was placed along the inferior aspect of the working space and unrolled into place to completely cover the direct, indirect, and femoral spaces. The mesh was secured into place superiorly to the anterior abdominal wall and inferiorly and medially to Cooper's ligament with absorbable sutures. Care was taken to avoid the inferolateral triangles containing the iliac vessels and genital nerves. The peritoneal flap was closed over the mesh and secured with suture in similar positions of safety. After ensuring adequate hemostasis, the trocars were removed and the pneumoperitoneum allowed to escape. The trocar incisions were closed using monocryl and skin adhesive dressings applied.  The patient tolerated the procedure well and was taken to the postanesthesia care unit in stable condition.   Specimen: Non  Complications: None  Estimated Blood Loss: 5 mL

## 2022-03-06 NOTE — Interval H&P Note (Signed)
History and Physical Interval Note:  03/06/2022 6:57 AM  Jeffery Cannon  has presented today for surgery, with the diagnosis of K40.20 non recurrent bil inguinal w/o obstruction or gangrene.  The various methods of treatment have been discussed with the patient and family. After consideration of risks, benefits and other options for treatment, the patient has consented to  Procedure(s): XI ROBOTIC ASSISTED BILATERAL INGUINAL HERNIA (Bilateral) as a surgical intervention.  The patient's history has been reviewed, patient examined, no change in status, stable for surgery.  I have reviewed the patient's chart and labs.  Questions were answered to the patient's satisfaction.     Herbert Pun

## 2022-03-06 NOTE — Discharge Instructions (Addendum)
  Diet: Resume home heart healthy regular diet.   Activity: No heavy lifting >20 pounds (children, pets, laundry, garbage) or strenuous activity until follow-up, but light activity and walking are encouraged. Do not drive or drink alcohol if taking narcotic pain medications.  Wound care: May shower with soapy water and pat dry (do not rub incisions), but no baths or submerging incision underwater until follow-up. (no swimming)   Medications: Resume all home medications. For mild to moderate pain: acetaminophen (Tylenol) ***or ibuprofen (if no kidney disease). Combining Tylenol with alcohol can substantially increase your risk of causing liver disease. Narcotic pain medications, if prescribed, can be used for severe pain, though may cause nausea, constipation, and drowsiness. Do not combine Tylenol and Norco within a 6 hour period as Norco contains Tylenol. If you do not need the narcotic pain medication, you do not need to fill the prescription.  Call office ([phone]) at any time if any questions, worsening pain, fevers/chills, bleeding, drainage from incision site, or other concerns.   AMBULATORY SURGERY  DISCHARGE INSTRUCTIONS   The drugs that you were given will stay in your system until tomorrow so for the next 24 hours you should not:  Drive an automobile Make any legal decisions Drink any alcoholic beverage   You may resume regular meals tomorrow.  Today it is better to start with liquids and gradually work up to solid foods.  You may eat anything you prefer, but it is better to start with liquids, then soup and crackers, and gradually work up to solid foods.   Please notify your doctor immediately if you have any unusual bleeding, trouble breathing, redness and pain at the surgery site, drainage, fever, or pain not relieved by medication.    Additional Instructions:        Please contact your physician with any problems or Same Day Surgery at [phone], Monday  through Friday 6 am to 4 pm, or  at [facility] Main number at [phone].  

## 2022-03-06 NOTE — Progress Notes (Signed)
Per Dr. Windell Moment patient can restart Asprin tomorrow 03/07/2022

## 2022-03-06 NOTE — Anesthesia Procedure Notes (Signed)
Procedure Name: Intubation Date/Time: 03/06/2022 7:37 AM  Performed by: Tollie Eth, CRNAPre-anesthesia Checklist: Patient identified, Patient being monitored, Timeout performed, Emergency Drugs available and Suction available Patient Re-evaluated:Patient Re-evaluated prior to induction Oxygen Delivery Method: Circle system utilized Preoxygenation: Pre-oxygenation with 100% oxygen Induction Type: IV induction Ventilation: Mask ventilation without difficulty Laryngoscope Size: McGraph and 4 Grade View: Grade I Tube type: Oral Tube size: 7.5 mm Number of attempts: 1 Airway Equipment and Method: Stylet and Video-laryngoscopy Placement Confirmation: ETT inserted through vocal cords under direct vision, positive ETCO2 and breath sounds checked- equal and bilateral Secured at: 23 cm Tube secured with: Tape Dental Injury: Teeth and Oropharynx as per pre-operative assessment

## 2022-03-06 NOTE — Transfer of Care (Signed)
Immediate Anesthesia Transfer of Care Note  Patient: Jeffery Cannon  Procedure(s) Performed: XI ROBOTIC ASSISTED BILATERAL INGUINAL HERNIA (Bilateral: Groin) INSERTION OF MESH  Patient Location: PACU  Anesthesia Type:General  Level of Consciousness: drowsy  Airway & Oxygen Therapy: Patient Spontanous Breathing and Patient connected to face mask oxygen  Post-op Assessment: Report given to RN and Post -op Vital signs reviewed and stable  Post vital signs: Reviewed and stable  Last Vitals:  Vitals Value Taken Time  BP 109/51 03/06/22 0952  Temp    Pulse 48 03/06/22 0955  Resp 15 03/06/22 0955  SpO2 99 % 03/06/22 0955  Vitals shown include unvalidated device data.  Last Pain:  Vitals:   03/06/22 0616  TempSrc: Temporal  PainSc: 0-No pain         Complications: No notable events documented.

## 2022-03-07 ENCOUNTER — Encounter: Payer: Self-pay | Admitting: General Surgery

## 2022-09-15 ENCOUNTER — Ambulatory Visit
Admission: RE | Admit: 2022-09-15 | Discharge: 2022-09-15 | Disposition: A | Discharge status: Home / Self Care | Payer: Medicare HMO | Source: Ambulatory Visit | Attending: Acute Care | Admitting: Acute Care

## 2022-09-15 DIAGNOSIS — Z87891 Personal history of nicotine dependence: Secondary | ICD-10-CM | POA: Insufficient documentation

## 2022-09-18 ENCOUNTER — Other Ambulatory Visit: Payer: Self-pay

## 2022-09-18 DIAGNOSIS — Z122 Encounter for screening for malignant neoplasm of respiratory organs: Secondary | ICD-10-CM

## 2022-09-18 DIAGNOSIS — Z87891 Personal history of nicotine dependence: Secondary | ICD-10-CM

## 2023-09-17 ENCOUNTER — Ambulatory Visit
Admission: RE | Admit: 2023-09-17 | Discharge: 2023-09-17 | Disposition: A | Discharge status: Home / Self Care | Payer: Medicare Other | Source: Ambulatory Visit | Attending: Acute Care | Admitting: Acute Care

## 2023-09-17 DIAGNOSIS — Z87891 Personal history of nicotine dependence: Secondary | ICD-10-CM | POA: Diagnosis present

## 2023-09-17 DIAGNOSIS — Z122 Encounter for screening for malignant neoplasm of respiratory organs: Secondary | ICD-10-CM | POA: Diagnosis present

## 2023-10-12 ENCOUNTER — Other Ambulatory Visit: Payer: Self-pay | Admitting: *Deleted

## 2023-10-12 DIAGNOSIS — Z87891 Personal history of nicotine dependence: Secondary | ICD-10-CM

## 2023-10-12 DIAGNOSIS — Z122 Encounter for screening for malignant neoplasm of respiratory organs: Secondary | ICD-10-CM

## 2024-01-16 IMAGING — CT CT CHEST LUNG CANCER SCREENING LOW DOSE W/O CM
2 of 5 series · 14 of 40 positions shown, 17 images · non-contrast
Comparison: Low-dose lung cancer screening chest CT 03/13/2016.
Chest CT 09/18/2017.

CLINICAL DATA: 75-year-old male former smoker (quit 11 years ago)
with 30 pack-year history of smoking. Lung cancer screening
examination.



[Series 3: lung 1.00 · axial · 0.77mm/px · z∈[-1208,-897]mm · 11 of 343 slices shown, 14 images]
[im 16/343  mediastinal]
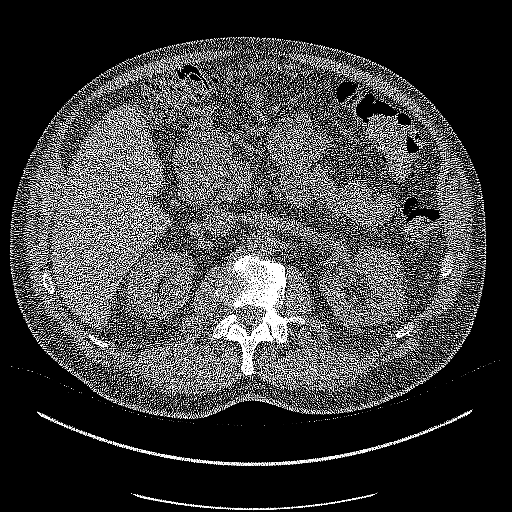
[im 16/343  lung]
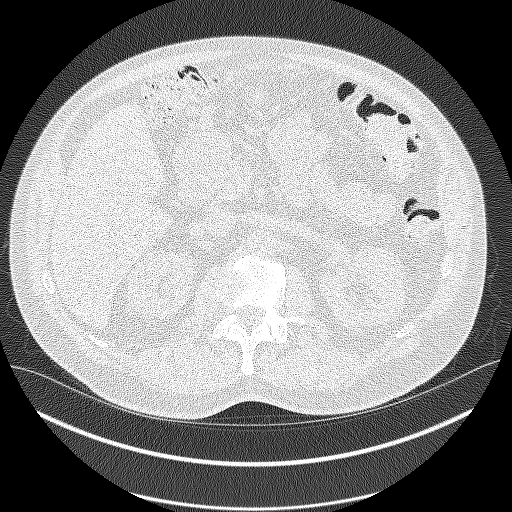
[im 47/343  lung]
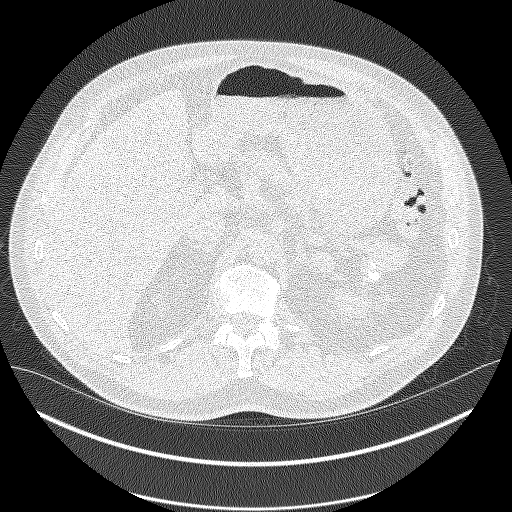
[im 78/343  lung]
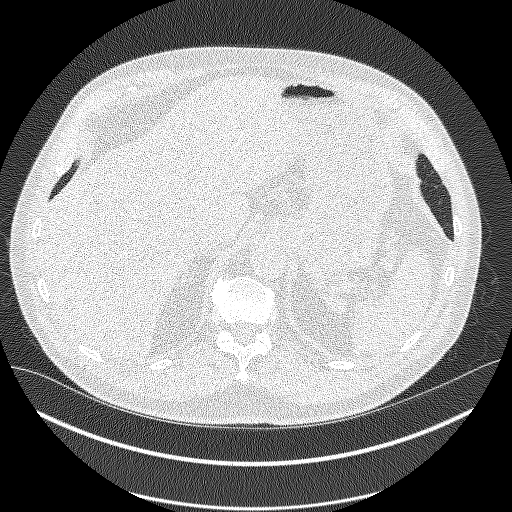
[im 109/343  lung]
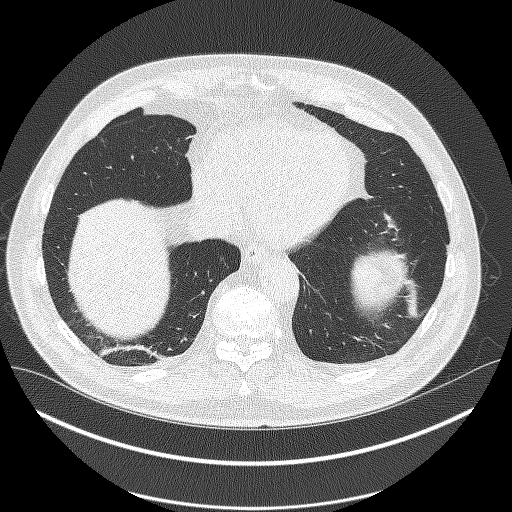
[im 140/343  mediastinal]
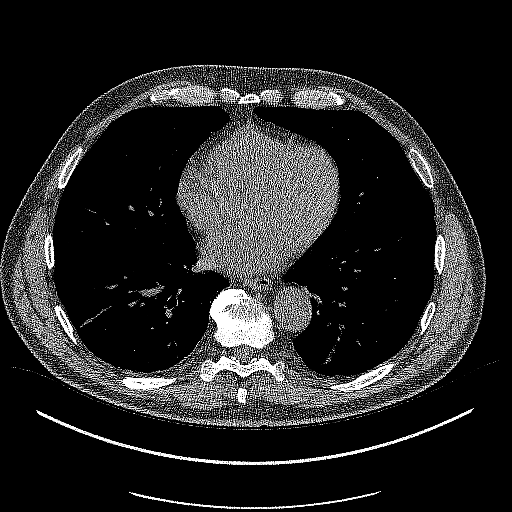
[im 140/343  lung]
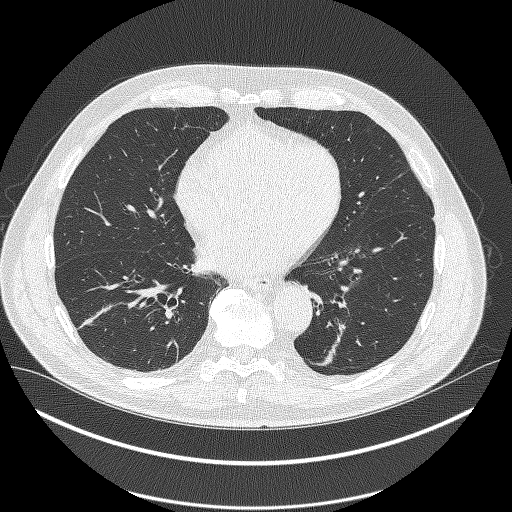
[im 172/343  lung]
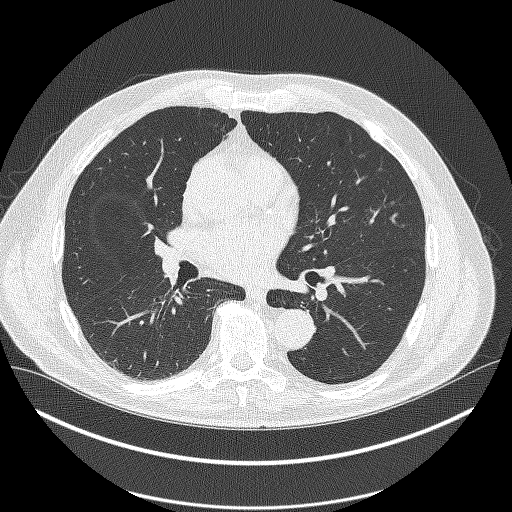
[im 203/343  lung]
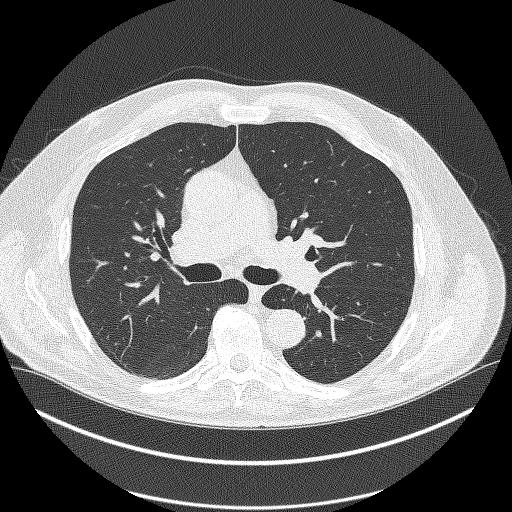
[im 234/343  lung]
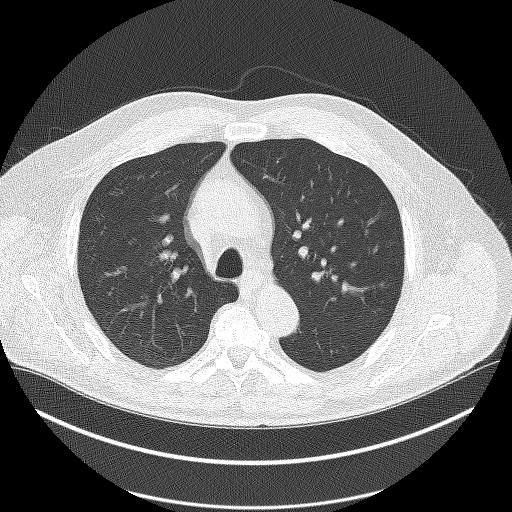
[im 265/343  mediastinal]
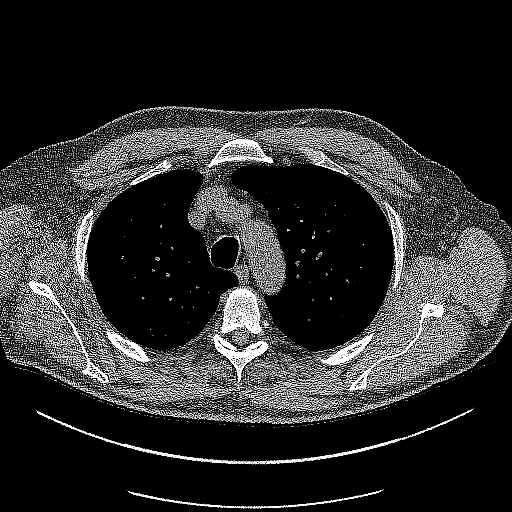
[im 265/343  lung]
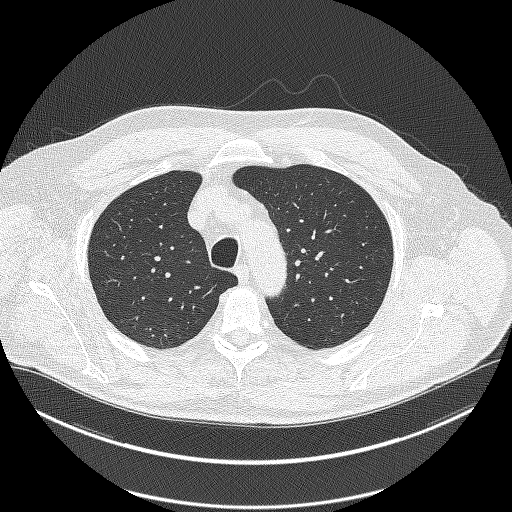
[im 296/343  lung]
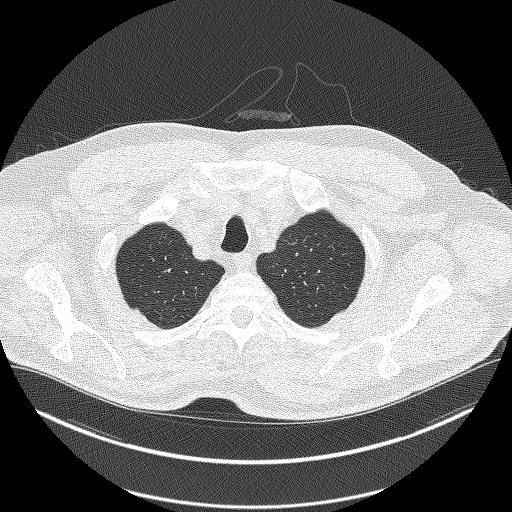
[im 327/343  lung]
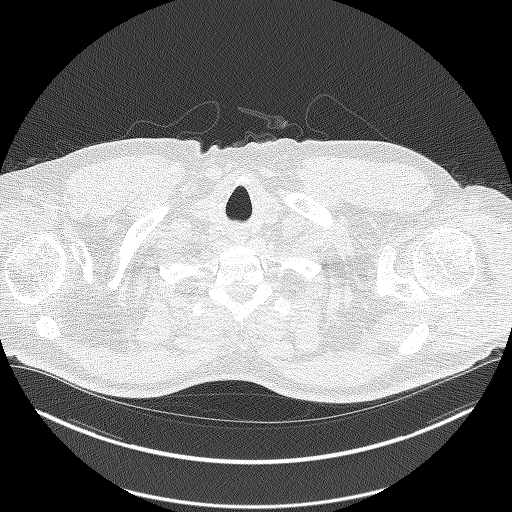

[Series 5: coronals lung 1.00 cor · coronal · 0.67mm/px · 3 of 371 slices shown]
[im 75/371  lung]
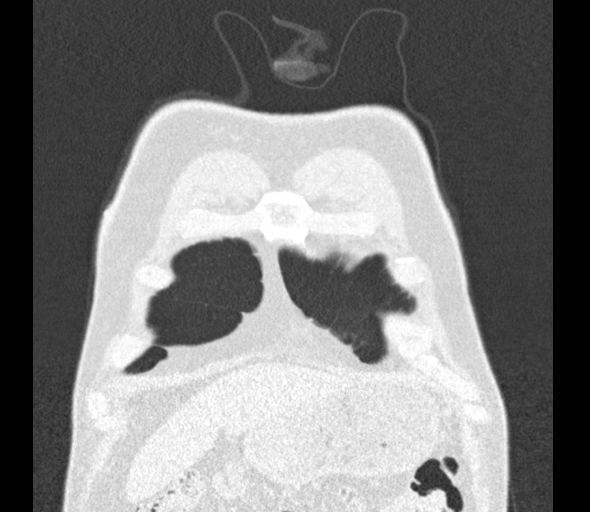
[im 149/371  lung]
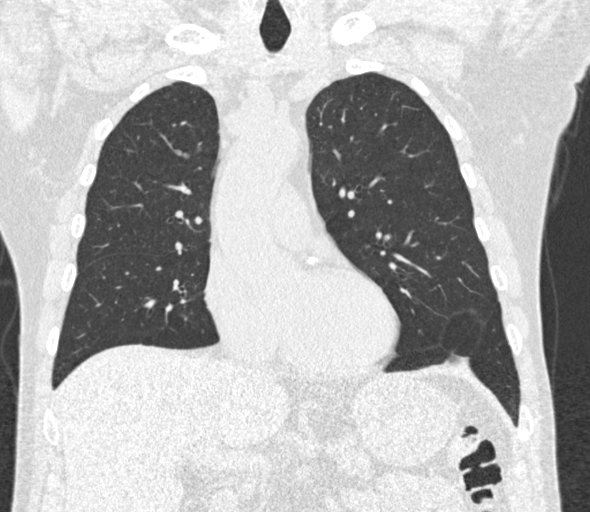
[im 223/371  lung]
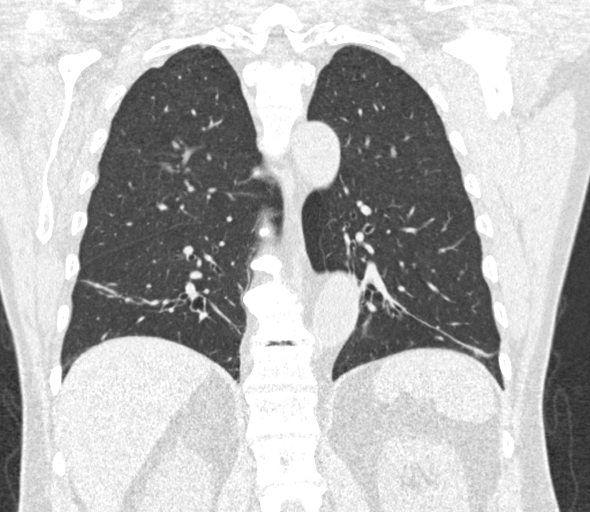

[14 of 40 positions shown; findings below may reference images not displayed]

FINDINGS: Cardiovascular: Heart size is normal. There is no significant
pericardial fluid, thickening or pericardial calcification. There is
aortic atherosclerosis, as well as atherosclerosis of the great
vessels of the mediastinum and the coronary arteries, including
calcified atherosclerotic plaque in the left main and left anterior
descending coronary arteries.

Mediastinum/Nodes: No pathologically enlarged mediastinal or hilar
lymph nodes. Esophagus is unremarkable in appearance. No axillary
lymphadenopathy.

Lungs/Pleura: Tiny pulmonary nodule in the anterior aspect of the
right lower lobe abutting the pleural surface (axial image 215 of
series 3), with a volume derived mean diameter of only 3.3 mm. No
other larger more suspicious appearing pulmonary nodules or masses
are noted. No acute consolidative airspace disease. No pleural
effusions. Mild diffuse bronchial wall thickening with mild
centrilobular and paraseptal emphysema.

Upper Abdomen: Aortic atherosclerosis.

Musculoskeletal: There are no aggressive appearing lytic or blastic
lesions noted in the visualized portions of the skeleton.
IMPRESSION: 1. Lung-RADS 2S, benign appearance or behavior. Continue annual
screening with low-dose chest CT without contrast in 12 months.
2. The "S" modifier above refers to potentially clinically
significant non lung cancer related findings. Specifically, there is
aortic atherosclerosis, in addition to left main and left anterior
descending coronary artery disease. Please note that although the
presence of coronary artery calcium documents the presence of
coronary artery disease, the severity of this disease and any
potential stenosis cannot be assessed on this non-gated CT
examination. Assessment for potential risk factor modification,
dietary therapy or pharmacologic therapy may be warranted, if
clinically indicated.
3. Mild diffuse bronchial wall thickening with mild centrilobular
and paraseptal emphysema; imaging findings suggestive of underlying
COPD.

Aortic Atherosclerosis ([id number].Q) and Emphysema ([id number].6).
